# Patient Record
Sex: Male | Born: 1995 | Race: White | Hispanic: No | Marital: Single | Smoking: Never smoker
Health system: Southern US, Community
[De-identification: ages and names within clinical notes are randomized; demographics above are authoritative.]

---
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
## 2016-12-31 DIAGNOSIS — R5383 Other fatigue: Secondary | ICD-10-CM | POA: Diagnosis not present

## 2016-12-31 DIAGNOSIS — Z6838 Body mass index (BMI) 38.0-38.9, adult: Secondary | ICD-10-CM | POA: Diagnosis not present

## 2016-12-31 DIAGNOSIS — Z1389 Encounter for screening for other disorder: Secondary | ICD-10-CM | POA: Diagnosis not present

## 2017-04-10 DIAGNOSIS — B9689 Other specified bacterial agents as the cause of diseases classified elsewhere: Secondary | ICD-10-CM | POA: Diagnosis not present

## 2017-04-10 DIAGNOSIS — L648 Other androgenic alopecia: Secondary | ICD-10-CM | POA: Diagnosis not present

## 2017-04-10 DIAGNOSIS — L0211 Cutaneous abscess of neck: Secondary | ICD-10-CM | POA: Diagnosis not present

## 2017-10-06 ENCOUNTER — Emergency Department (HOSPITAL_COMMUNITY): Payer: BLUE CROSS/BLUE SHIELD

## 2017-10-06 ENCOUNTER — Other Ambulatory Visit: Payer: Self-pay

## 2017-10-06 ENCOUNTER — Emergency Department (HOSPITAL_COMMUNITY)
Admission: EM | Admit: 2017-10-06 | Discharge: 2017-10-06 | Disposition: A | Discharge status: Home / Self Care | Payer: BLUE CROSS/BLUE SHIELD | Attending: Emergency Medicine | Admitting: Emergency Medicine

## 2017-10-06 ENCOUNTER — Encounter (HOSPITAL_COMMUNITY): Payer: Self-pay | Admitting: Emergency Medicine

## 2017-10-06 DIAGNOSIS — R079 Chest pain, unspecified: Secondary | ICD-10-CM | POA: Diagnosis not present

## 2017-10-06 DIAGNOSIS — R0789 Other chest pain: Secondary | ICD-10-CM | POA: Diagnosis not present

## 2017-10-06 LAB — BASIC METABOLIC PANEL
Anion gap: 7 (ref 5–15)
BUN: 11 mg/dL (ref 6–20)
CALCIUM: 9.8 mg/dL (ref 8.9–10.3)
CO2: 30 mmol/L (ref 22–32)
CREATININE: 0.89 mg/dL (ref 0.61–1.24)
Chloride: 102 mmol/L (ref 98–111)
GFR calc Af Amer: >60 mL/min (ref >60)
Glucose, Bld: 112 mg/dL — ABNORMAL HIGH (ref 70–99)
POTASSIUM: 3.7 mmol/L (ref 3.5–5.1)
SODIUM: 139 mmol/L (ref 135–145)

## 2017-10-06 LAB — CBC
HCT: 44.8 % (ref 39.0–52.0)
Hemoglobin: 15.5 g/dL (ref 13.0–17.0)
MCH: 30.1 pg (ref 26.0–34.0)
MCHC: 34.6 g/dL (ref 30.0–36.0)
MCV: 87 fL (ref 78.0–100.0)
PLATELETS: 277 10*3/uL (ref 150–400)
RBC: 5.15 MIL/uL (ref 4.22–5.81)
RDW: 13.1 % (ref 11.5–15.5)
WBC: 8.4 10*3/uL (ref 4.0–10.5)

## 2017-10-06 LAB — TROPONIN I

## 2017-10-06 MED ORDER — IBUPROFEN 400 MG PO TABS
600.0000 mg | ORAL_TABLET | Freq: Once | ORAL | Status: AC
  Administered 2017-10-06: 600 mg via ORAL
  Filled 2017-10-06: qty 2
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
## 2017-10-06 NOTE — ED Triage Notes (Signed)
Pt reports having sharp intermittent pains at the top of his chest right under throat with deep breathing.  States he feels very anxious and is not sure why.  Has been ongoing for 3 days.

## 2017-10-06 NOTE — ED Notes (Signed)
Pt and family updated about wait time.

## 2017-10-06 NOTE — Discharge Instructions (Addendum)
Take tylenol and Advil for pain. Follow up with your doctor in the next few days for recheck. Return here for worsening symptoms.

## 2017-10-23 DIAGNOSIS — Z6829 Body mass index (BMI) 29.0-29.9, adult: Secondary | ICD-10-CM | POA: Diagnosis not present

## 2017-10-23 DIAGNOSIS — Z23 Encounter for immunization: Secondary | ICD-10-CM | POA: Diagnosis not present

## 2017-10-23 DIAGNOSIS — M940 Chondrocostal junction syndrome [Tietze]: Secondary | ICD-10-CM | POA: Diagnosis not present

## 2017-10-23 DIAGNOSIS — R739 Hyperglycemia, unspecified: Secondary | ICD-10-CM | POA: Diagnosis not present

## 2017-10-23 DIAGNOSIS — R634 Abnormal weight loss: Secondary | ICD-10-CM | POA: Diagnosis not present

## 2017-10-23 DIAGNOSIS — Z0000 Encounter for general adult medical examination without abnormal findings: Secondary | ICD-10-CM | POA: Diagnosis not present

## 2017-10-23 DIAGNOSIS — E663 Overweight: Secondary | ICD-10-CM | POA: Diagnosis not present

## 2017-11-09 DIAGNOSIS — H5712 Ocular pain, left eye: Secondary | ICD-10-CM | POA: Diagnosis not present

## 2017-11-09 DIAGNOSIS — R51 Headache: Secondary | ICD-10-CM | POA: Diagnosis not present

## 2017-11-09 DIAGNOSIS — B309 Viral conjunctivitis, unspecified: Secondary | ICD-10-CM | POA: Diagnosis not present

## 2017-11-09 DIAGNOSIS — R200 Anesthesia of skin: Secondary | ICD-10-CM | POA: Diagnosis not present

## 2017-11-18 DIAGNOSIS — H20012 Primary iridocyclitis, left eye: Secondary | ICD-10-CM | POA: Diagnosis not present

## 2017-11-20 DIAGNOSIS — H20012 Primary iridocyclitis, left eye: Secondary | ICD-10-CM | POA: Diagnosis not present

## 2017-11-23 DIAGNOSIS — H209 Unspecified iridocyclitis: Secondary | ICD-10-CM | POA: Diagnosis not present

## 2017-11-23 DIAGNOSIS — H5712 Ocular pain, left eye: Secondary | ICD-10-CM | POA: Diagnosis not present

## 2017-11-24 DIAGNOSIS — H20012 Primary iridocyclitis, left eye: Secondary | ICD-10-CM | POA: Diagnosis not present

## 2017-11-28 DIAGNOSIS — H18822 Corneal disorder due to contact lens, left eye: Secondary | ICD-10-CM | POA: Diagnosis not present

## 2017-11-28 DIAGNOSIS — H16292 Other keratoconjunctivitis, left eye: Secondary | ICD-10-CM | POA: Diagnosis not present

## 2017-12-11 DIAGNOSIS — H18822 Corneal disorder due to contact lens, left eye: Secondary | ICD-10-CM | POA: Diagnosis not present

## 2017-12-11 DIAGNOSIS — H16292 Other keratoconjunctivitis, left eye: Secondary | ICD-10-CM | POA: Diagnosis not present

## 2018-01-20 DIAGNOSIS — H179 Unspecified corneal scar and opacity: Secondary | ICD-10-CM | POA: Diagnosis not present

## 2018-01-20 DIAGNOSIS — H10432 Chronic follicular conjunctivitis, left eye: Secondary | ICD-10-CM | POA: Diagnosis not present

## 2018-01-20 DIAGNOSIS — H16422 Pannus (corneal), left eye: Secondary | ICD-10-CM | POA: Diagnosis not present

## 2018-01-20 DIAGNOSIS — Z79899 Other long term (current) drug therapy: Secondary | ICD-10-CM | POA: Diagnosis not present

## 2018-01-20 DIAGNOSIS — H15102 Unspecified episcleritis, left eye: Secondary | ICD-10-CM | POA: Diagnosis not present

## 2018-02-13 DIAGNOSIS — Z1389 Encounter for screening for other disorder: Secondary | ICD-10-CM | POA: Diagnosis not present

## 2018-02-13 DIAGNOSIS — Z6828 Body mass index (BMI) 28.0-28.9, adult: Secondary | ICD-10-CM | POA: Diagnosis not present

## 2018-02-13 DIAGNOSIS — H2000 Unspecified acute and subacute iridocyclitis: Secondary | ICD-10-CM | POA: Diagnosis not present

## 2018-02-13 DIAGNOSIS — E663 Overweight: Secondary | ICD-10-CM | POA: Diagnosis not present

## 2018-02-13 DIAGNOSIS — R768 Other specified abnormal immunological findings in serum: Secondary | ICD-10-CM | POA: Diagnosis not present

## 2018-03-03 DIAGNOSIS — H16423 Pannus (corneal), bilateral: Secondary | ICD-10-CM | POA: Diagnosis not present

## 2018-03-03 DIAGNOSIS — H179 Unspecified corneal scar and opacity: Secondary | ICD-10-CM | POA: Diagnosis not present

## 2018-03-03 DIAGNOSIS — H15102 Unspecified episcleritis, left eye: Secondary | ICD-10-CM | POA: Diagnosis not present

## 2018-07-02 DIAGNOSIS — M549 Dorsalgia, unspecified: Secondary | ICD-10-CM | POA: Diagnosis not present

## 2018-07-02 DIAGNOSIS — Z1389 Encounter for screening for other disorder: Secondary | ICD-10-CM | POA: Diagnosis not present

## 2018-07-02 DIAGNOSIS — S31821S Laceration without foreign body of left buttock, sequela: Secondary | ICD-10-CM | POA: Diagnosis not present

## 2018-07-02 DIAGNOSIS — Z7251 High risk heterosexual behavior: Secondary | ICD-10-CM | POA: Diagnosis not present

## 2018-07-02 DIAGNOSIS — Z6827 Body mass index (BMI) 27.0-27.9, adult: Secondary | ICD-10-CM | POA: Diagnosis not present

## 2018-07-02 DIAGNOSIS — S31831D Laceration without foreign body of anus, subsequent encounter: Secondary | ICD-10-CM | POA: Diagnosis not present

## 2018-07-14 DIAGNOSIS — Z8669 Personal history of other diseases of the nervous system and sense organs: Secondary | ICD-10-CM | POA: Diagnosis not present

## 2018-07-14 DIAGNOSIS — M542 Cervicalgia: Secondary | ICD-10-CM | POA: Diagnosis not present

## 2018-07-14 DIAGNOSIS — M546 Pain in thoracic spine: Secondary | ICD-10-CM | POA: Diagnosis not present

## 2018-07-14 DIAGNOSIS — M79671 Pain in right foot: Secondary | ICD-10-CM | POA: Diagnosis not present

## 2018-07-14 DIAGNOSIS — M2550 Pain in unspecified joint: Secondary | ICD-10-CM | POA: Diagnosis not present

## 2018-07-14 DIAGNOSIS — R768 Other specified abnormal immunological findings in serum: Secondary | ICD-10-CM | POA: Diagnosis not present

## 2018-07-14 DIAGNOSIS — M79642 Pain in left hand: Secondary | ICD-10-CM | POA: Diagnosis not present

## 2018-07-14 DIAGNOSIS — M79641 Pain in right hand: Secondary | ICD-10-CM | POA: Diagnosis not present

## 2018-07-14 DIAGNOSIS — M79643 Pain in unspecified hand: Secondary | ICD-10-CM | POA: Diagnosis not present

## 2018-07-14 DIAGNOSIS — M533 Sacrococcygeal disorders, not elsewhere classified: Secondary | ICD-10-CM | POA: Diagnosis not present

## 2018-07-14 DIAGNOSIS — M545 Low back pain: Secondary | ICD-10-CM | POA: Diagnosis not present

## 2018-07-14 DIAGNOSIS — M549 Dorsalgia, unspecified: Secondary | ICD-10-CM | POA: Diagnosis not present

## 2018-07-14 DIAGNOSIS — M79672 Pain in left foot: Secondary | ICD-10-CM | POA: Diagnosis not present

## 2018-07-15 DIAGNOSIS — M79641 Pain in right hand: Secondary | ICD-10-CM | POA: Diagnosis not present

## 2018-07-15 DIAGNOSIS — M79642 Pain in left hand: Secondary | ICD-10-CM | POA: Diagnosis not present

## 2018-07-15 DIAGNOSIS — M1990 Unspecified osteoarthritis, unspecified site: Secondary | ICD-10-CM | POA: Diagnosis not present

## 2018-07-15 DIAGNOSIS — M79671 Pain in right foot: Secondary | ICD-10-CM | POA: Diagnosis not present

## 2018-07-15 DIAGNOSIS — M549 Dorsalgia, unspecified: Secondary | ICD-10-CM | POA: Diagnosis not present

## 2018-07-28 DIAGNOSIS — R768 Other specified abnormal immunological findings in serum: Secondary | ICD-10-CM | POA: Diagnosis not present

## 2018-07-28 DIAGNOSIS — M79643 Pain in unspecified hand: Secondary | ICD-10-CM | POA: Diagnosis not present

## 2018-07-28 DIAGNOSIS — M549 Dorsalgia, unspecified: Secondary | ICD-10-CM | POA: Diagnosis not present

## 2018-07-28 DIAGNOSIS — M2550 Pain in unspecified joint: Secondary | ICD-10-CM | POA: Diagnosis not present

## 2018-08-05 ENCOUNTER — Other Ambulatory Visit: Payer: Self-pay | Admitting: Rheumatology

## 2018-08-05 DIAGNOSIS — M461 Sacroiliitis, not elsewhere classified: Secondary | ICD-10-CM

## 2018-08-07 ENCOUNTER — Other Ambulatory Visit: Payer: BLUE CROSS/BLUE SHIELD

## 2018-08-14 DIAGNOSIS — K602 Anal fissure, unspecified: Secondary | ICD-10-CM | POA: Diagnosis not present

## 2018-11-24 DIAGNOSIS — H179 Unspecified corneal scar and opacity: Secondary | ICD-10-CM | POA: Diagnosis not present

## 2018-11-24 DIAGNOSIS — H16423 Pannus (corneal), bilateral: Secondary | ICD-10-CM | POA: Diagnosis not present

## 2018-11-24 DIAGNOSIS — H15102 Unspecified episcleritis, left eye: Secondary | ICD-10-CM | POA: Diagnosis not present

## 2018-12-01 DIAGNOSIS — H179 Unspecified corneal scar and opacity: Secondary | ICD-10-CM | POA: Diagnosis not present

## 2018-12-01 DIAGNOSIS — H16423 Pannus (corneal), bilateral: Secondary | ICD-10-CM | POA: Diagnosis not present

## 2018-12-01 DIAGNOSIS — H209 Unspecified iridocyclitis: Secondary | ICD-10-CM | POA: Diagnosis not present

## 2018-12-10 DIAGNOSIS — Z9889 Other specified postprocedural states: Secondary | ICD-10-CM | POA: Diagnosis not present

## 2018-12-10 DIAGNOSIS — H209 Unspecified iridocyclitis: Secondary | ICD-10-CM | POA: Diagnosis not present

## 2018-12-10 DIAGNOSIS — H16423 Pannus (corneal), bilateral: Secondary | ICD-10-CM | POA: Diagnosis not present

## 2018-12-10 DIAGNOSIS — H179 Unspecified corneal scar and opacity: Secondary | ICD-10-CM | POA: Diagnosis not present

## 2018-12-17 DIAGNOSIS — H179 Unspecified corneal scar and opacity: Secondary | ICD-10-CM | POA: Diagnosis not present

## 2018-12-17 DIAGNOSIS — Z8669 Personal history of other diseases of the nervous system and sense organs: Secondary | ICD-10-CM | POA: Diagnosis not present

## 2018-12-17 DIAGNOSIS — H209 Unspecified iridocyclitis: Secondary | ICD-10-CM | POA: Diagnosis not present

## 2018-12-17 DIAGNOSIS — H16423 Pannus (corneal), bilateral: Secondary | ICD-10-CM | POA: Diagnosis not present

## 2018-12-28 DIAGNOSIS — H04123 Dry eye syndrome of bilateral lacrimal glands: Secondary | ICD-10-CM | POA: Diagnosis not present

## 2019-01-06 ENCOUNTER — Ambulatory Visit
Admission: RE | Admit: 2019-01-06 | Discharge: 2019-01-06 | Disposition: A | Discharge status: Home / Self Care | Payer: BC Managed Care – PPO | Source: Ambulatory Visit | Attending: Rheumatology | Admitting: Rheumatology

## 2019-01-06 ENCOUNTER — Other Ambulatory Visit: Payer: Self-pay

## 2019-01-06 DIAGNOSIS — R600 Localized edema: Secondary | ICD-10-CM | POA: Diagnosis not present

## 2019-01-06 DIAGNOSIS — M461 Sacroiliitis, not elsewhere classified: Secondary | ICD-10-CM

## 2019-01-12 DIAGNOSIS — H16422 Pannus (corneal), left eye: Secondary | ICD-10-CM | POA: Diagnosis not present

## 2019-01-12 DIAGNOSIS — Z8669 Personal history of other diseases of the nervous system and sense organs: Secondary | ICD-10-CM | POA: Diagnosis not present

## 2019-01-12 DIAGNOSIS — H179 Unspecified corneal scar and opacity: Secondary | ICD-10-CM | POA: Diagnosis not present

## 2019-01-12 DIAGNOSIS — H209 Unspecified iridocyclitis: Secondary | ICD-10-CM | POA: Diagnosis not present

## 2019-04-17 ENCOUNTER — Ambulatory Visit: Payer: Self-pay | Attending: Internal Medicine

## 2019-04-17 DIAGNOSIS — Z23 Encounter for immunization: Secondary | ICD-10-CM

## 2019-04-17 NOTE — Progress Notes (Signed)
   Covid-19 Vaccination Clinic  Name:  FREDI GEILER    MRN: [id number] DOB: [birth year]  04/17/2019  Mr. Luecke was observed post Covid-19 immunization for 15 minutes without incident. He was provided with Vaccine Information Sheet and instruction to access the V-Safe system.   Mr. Edelman was instructed to call 911 with any severe reactions post vaccine: Marland Kitchen Difficulty breathing  . Swelling of face and throat  . A fast heartbeat  . A bad rash all over body  . Dizziness and weakness   Immunizations Administered    Name Date Dose VIS Date Route   Moderna COVID-19 Vaccine 04/17/2019 12:16 PM 0.5 mL 01/05/2019 Intramuscular   Manufacturer: Moderna   Lot: [id number]   NDC: 07622-633-35

## 2019-05-19 ENCOUNTER — Ambulatory Visit: Payer: Self-pay | Attending: Internal Medicine

## 2019-05-19 DIAGNOSIS — Z23 Encounter for immunization: Secondary | ICD-10-CM

## 2019-05-19 NOTE — Progress Notes (Signed)
   Covid-19 Vaccination Clinic  Name:  Anthony Patel    MRN: [id number] DOB: 1995-06-15  05/19/2019  [name] was observed post Covid-19 immunization for 15 minutes without incident. He was provided with Vaccine Information Sheet and instruction to access the V-Safe system.   [name] was instructed to call 911 with any severe reactions post vaccine: Marland Kitchen Difficulty breathing  . Swelling of face and throat  . A fast heartbeat  . A bad rash all over body  . Dizziness and weakness   Immunizations Administered    Name Date Dose VIS Date Route   Moderna COVID-19 Vaccine 05/19/2019 11:46 AM 0.5 mL 01/05/2019 Intramuscular   Manufacturer: Moderna   Lot: [id number]   NDC: 53614-431-54

## 2019-09-06 ENCOUNTER — Inpatient Hospital Stay
Admission: RE | Admit: 2019-09-06 | Discharge: 2019-09-06 | Disposition: A | Discharge status: Home / Self Care | Payer: Self-pay | Source: Ambulatory Visit

## 2019-09-06 ENCOUNTER — Emergency Department (HOSPITAL_COMMUNITY): Payer: 59

## 2019-09-06 ENCOUNTER — Other Ambulatory Visit: Payer: Self-pay

## 2019-09-06 ENCOUNTER — Emergency Department (HOSPITAL_COMMUNITY)
Admission: EM | Admit: 2019-09-06 | Discharge: 2019-09-06 | Disposition: A | Discharge status: Home / Self Care | Payer: 59 | Attending: Emergency Medicine | Admitting: Emergency Medicine

## 2019-09-06 ENCOUNTER — Encounter (HOSPITAL_COMMUNITY): Payer: Self-pay | Admitting: *Deleted

## 2019-09-06 DIAGNOSIS — N50811 Right testicular pain: Secondary | ICD-10-CM | POA: Insufficient documentation

## 2019-09-06 LAB — URINALYSIS, ROUTINE W REFLEX MICROSCOPIC
Bilirubin Urine: NEGATIVE
Glucose, UA: NEGATIVE mg/dL
Hgb urine dipstick: NEGATIVE
Ketones, ur: 5 mg/dL — AB
Leukocytes,Ua: NEGATIVE
Nitrite: NEGATIVE
Protein, ur: NEGATIVE mg/dL
Specific Gravity, Urine: 1.012 (ref 1.005–1.030)
pH: 6 (ref 5.0–8.0)

## 2019-09-06 NOTE — ED Provider Notes (Signed)
[facility] EMERGENCY DEPARTMENT Provider Note   CSN: [id number] Arrival date & time: 09/06/19  1530     History Chief Complaint  Patient presents with  . Testicle Pain    Tory P Keast is a 24 y.o. male with a past medical history of possible ankylosing spondylitis who presents today for evaluation of pain in his right testicle.  He reports that a month ago he developed pain in the right testicle.  He notes that his dog had jumped on him prior to this starting however states that she has done so in the past without the same pain reactions.  He reports that he has not had any unprotected sex in over a year, denies any penile discharge.  No scrotal swelling.  He denies any fevers, lesions, abnormal rashes.  He states that normally wears briefs and when he does not have the brace on he notes increased pain.  He reports that occasionally he has a bulge in the right inguinal area however this is not constant.  He denies any other aggravating or alleviating factors.  HPI     History reviewed. No pertinent past medical history.  There are no problems to display for this patient.   History reviewed. No pertinent surgical history.     No family history on file.  Social History   Tobacco Use  . Smoking status: Never Smoker  . Smokeless tobacco: Never Used  Substance Use Topics  . Alcohol use: Yes    Comment: 1-3 times weekly  . Drug use: Never    Home Medications Prior to Admission medications   Not on File    Allergies    Penicillins  Review of Systems   Review of Systems  Constitutional: Negative for chills, fatigue and fever.  Gastrointestinal: Negative for abdominal pain, diarrhea, nausea and vomiting.  Genitourinary: Positive for frequency, testicular pain and urgency. Negative for difficulty urinating, discharge, dysuria, flank pain, genital sores, hematuria, penile pain, penile swelling and scrotal swelling.  Musculoskeletal: Negative for back pain and neck pain.    Skin: Negative for color change, rash and wound.  Neurological: Negative for weakness and headaches.  All other systems reviewed and are negative.   Physical Exam Updated Vital Signs BP 120/86 (BP Location: Right Arm)   Pulse 79   Temp 99 F (37.2 C) (Oral)   Resp 17   SpO2 100%   Physical Exam Vitals and nursing note reviewed. Exam conducted with a chaperone present Richardo Hanks).  Constitutional:      General: He is not in acute distress.    Appearance: He is well-developed. He is not diaphoretic.  HENT:     Head: Normocephalic and atraumatic.  Eyes:     General: No scleral icterus.       Right eye: No discharge.        Left eye: No discharge.     Conjunctiva/sclera: Conjunctivae normal.  Cardiovascular:     Rate and Rhythm: Normal rate and regular rhythm.  Pulmonary:     Effort: Pulmonary effort is normal. No respiratory distress.     Breath sounds: No stridor.  Abdominal:     General: There is no distension.     Tenderness: There is no abdominal tenderness.     Hernia: There is no hernia in the left inguinal area or right inguinal area.  Genitourinary:    Penis: Normal and circumcised.      Testes:        Right: Tenderness present. Mass,  swelling, testicular hydrocele or varicocele not present.        Left: Mass, tenderness, testicular hydrocele or varicocele not present.     Epididymis:     Right: Normal.     Left: Normal.     Comments: No abnormal erythema, edema, induration or lesions noted on the anterior scrotum. Musculoskeletal:        General: No deformity.     Cervical back: Normal range of motion.  Skin:    General: Skin is warm and dry.  Neurological:     Mental Status: He is alert.     Motor: No abnormal muscle tone.  Psychiatric:        Mood and Affect: Mood is anxious.        Behavior: Behavior normal.     ED Results / Procedures / Treatments   Labs (all labs ordered are listed, but only abnormal results are displayed) Labs Reviewed   URINALYSIS, ROUTINE W REFLEX MICROSCOPIC - Abnormal; Notable for the following components:      Result Value   Ketones, ur 5 (*)    All other components within normal limits  GC/CHLAMYDIA PROBE AMP ([facility]) NOT AT [facility]    EKG None  Radiology US SCROTUM W/DOPPLER  Result Date: 09/06/2019 CLINICAL DATA:  Order requisition indicated left testicular pain, clarified with patient ordering provider that this is right testicular pain EXAM: SCROTAL ULTRASOUND DOPPLER ULTRASOUND OF THE TESTICLES TECHNIQUE: Complete ultrasound examination of the testicles, epididymis, and other scrotal structures was performed. Color and spectral Doppler ultrasound were also utilized to evaluate blood flow to the testicles. COMPARISON:  None. FINDINGS: Right testicle Measurements: 5.3 x 2.7 x 3.2 cm. No mass or microlithiasis visualized. Left testicle Measurements: 4.8 x 2.0 x 3.3 cm. No mass or microlithiasis visualized. Right epididymis:  Normal in size and appearance. Left epididymis:  Normal in size and appearance. Hydrocele: Trace amount fluid bilaterally likely within physiologic normal. Varicocele:  None visualized. Pulsed Doppler interrogation of both testes demonstrates normal low resistance arterial and venous waveforms bilaterally. IMPRESSION: No acute testicular injury. No concerning testicular mass, evidence of torsion or epididymo-orchitis. Electronically Signed   By: Kreg Shropshire M.D.   On: 09/06/2019 17:57    Procedures Procedures (including critical care time)  Medications Ordered in ED Medications - No data to display  ED Course  I have reviewed the triage vital signs and the nursing notes.  Pertinent labs & imaging results that were available during my care of the patient were reviewed by me and considered in my medical decision making (see chart for details).    MDM Rules/Calculators/A&P                         Patient is a 24 year old man who presents today for evaluation of 1 month of  pain in his right testicle.  On exam he has mild tenderness to palpation in the right testicle diffusely.  This started after his dog jumped on him however he reports that the dog is jumped on him similarly multiple times before and he has not had this lingering pain.  He notes occasional swelling superior to the testicles mostly on the right side however on exam unable to produce a hernia.  ReSound was obtained without evidence of acute testicular injury, concerning mass evidence of torsion or epididymoorchitis.  No visualized scrotal lesions.  UA without evidence of infection.  GC testing is sent.  I recommended urology follow-up.  We discussed that he may have a hernia today and simply unable to detect today.  Return precautions were discussed with patient who states their understanding.  At the time of discharge patient denied any unaddressed complaints or concerns.  Patient is agreeable for discharge home.  Note: Portions of this report may have been transcribed using voice recognition software. Every effort was made to ensure accuracy; however, inadvertent computerized transcription errors may be present  Final Clinical Impression(s) / ED Diagnoses Final diagnoses:  Right testicular pain    Rx / DC Orders ED Discharge Orders    None       Norman Clay 09/06/19 2305    Milagros Loll, MD 09/08/19 1555

## 2019-09-06 NOTE — ED Triage Notes (Signed)
Right sided testicle pain for a month

## 2019-09-06 NOTE — Discharge Instructions (Addendum)
Please continue to wear supportive underwear.  Today your urine and ultrasound were both reassuring.  I have given you follow-up with urology.  If your pain becomes severe, you develop fevers, your pain suddenly worsens, or you have other concerns please seek additional medical care and evaluation.  You have testing for gonorrhea and chlamydia sent.  If these are positive you will be notified and you will need to seek treatment.  Please take Ibuprofen (Advil, motrin) and Tylenol (acetaminophen) to relieve your pain.  You may take up to 600 MG (3 pills) of normal strength ibuprofen every 8 hours as needed.  In between doses of ibuprofen you make take tylenol, up to 1,000 mg (two extra strength pills).  Do not take more than 3,000 mg tylenol in a 24 hour period.  Please check all medication labels as many medications such as pain and cold medications may contain tylenol.  Do not drink alcohol while taking these medications.  Do not take other NSAID'S while taking ibuprofen (such as aleve or naproxen).  Please take ibuprofen with food to decrease stomach upset.

## 2019-09-08 LAB — GC/CHLAMYDIA PROBE AMP (~~LOC~~) NOT AT ARMC
Chlamydia: NEGATIVE
Comment: NEGATIVE
Comment: NORMAL
Neisseria Gonorrhea: NEGATIVE

## 2020-04-14 IMAGING — MR MR SACRUM / SI JOINTS WO CM
4 of 5 series · 31 of 48 positions shown · non-contrast
Comparison: None.

CLINICAL DATA: SI joint pain for 1-2 years.  Worsening pain.

EXAM:
MRI PELVIS WITHOUT CONTRAST
TECHNIQUE: Multiplanar, multisequence MR imaging of the pelvis was performed.
No intravenous contrast was administered.

[Series 2: T1 · axial · 4.0mm · 0.52mm/px · z∈[-14,+122]mm · 8 of 30 slices shown (1 of 2)]
[im 1/30]
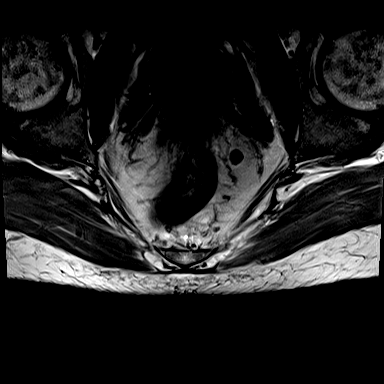
[im 4/30]
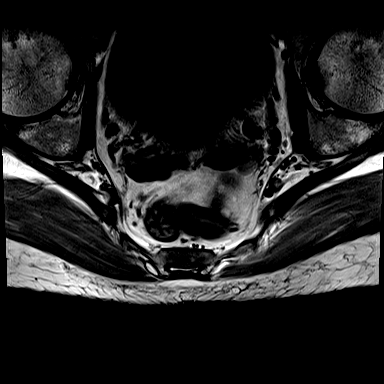
[im 10/30]
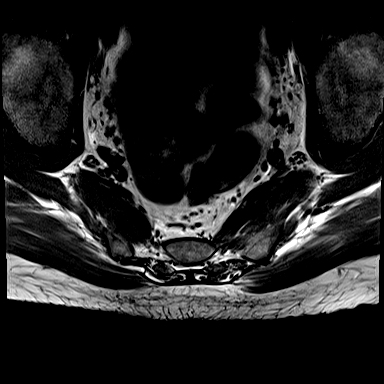
[im 13/30]
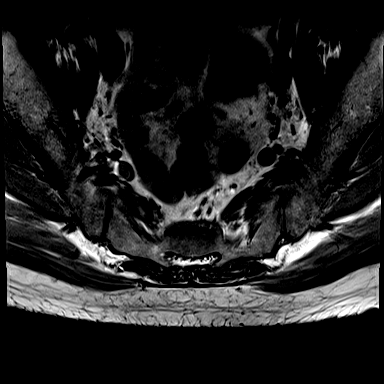
[im 17/30]
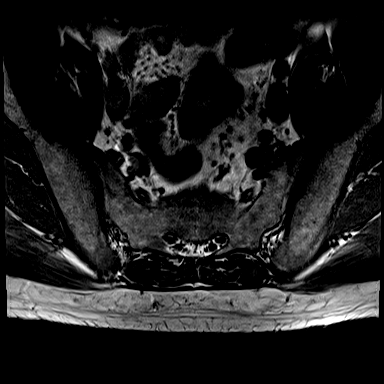
[im 20/30]
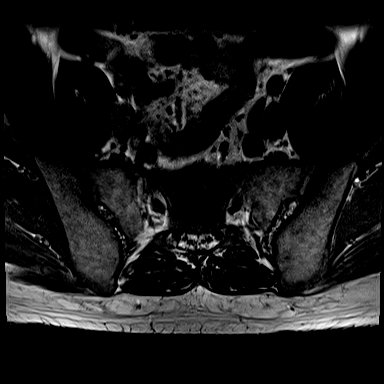
[im 26/30]
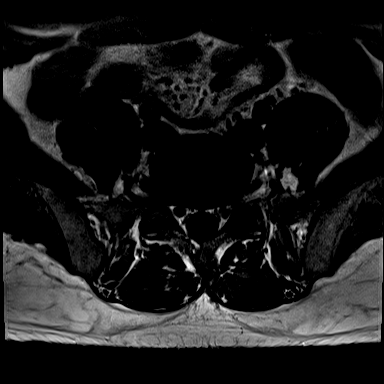
[im 30/30]
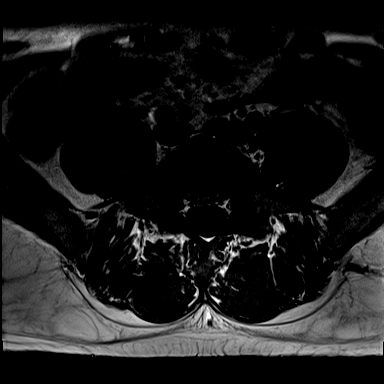

[Series 3: T2 fat-sat · axial · 4.0mm · 0.52mm/px · z∈[-14,+122]mm · 8 of 30 slices shown (1 of 2)]
[im 1/30]
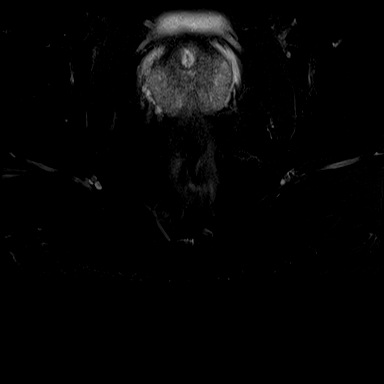
[im 4/30]
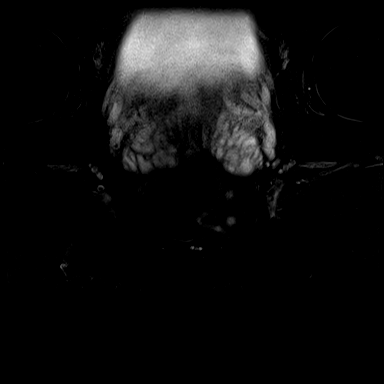
[im 10/30]
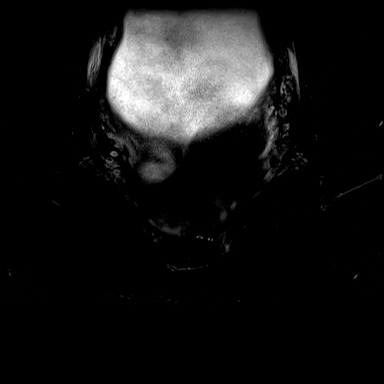
[im 13/30]
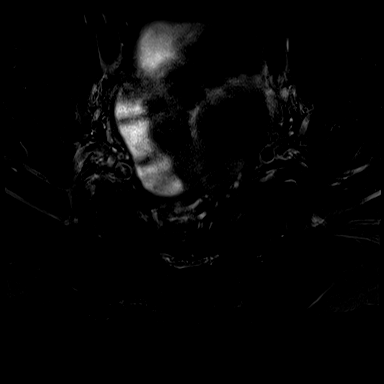
[im 17/30]
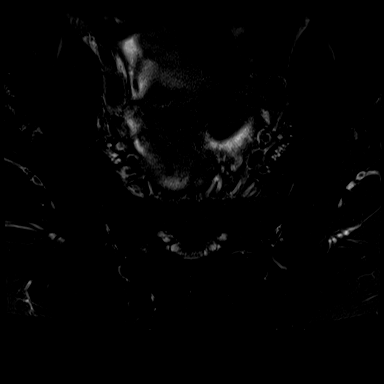
[im 20/30]
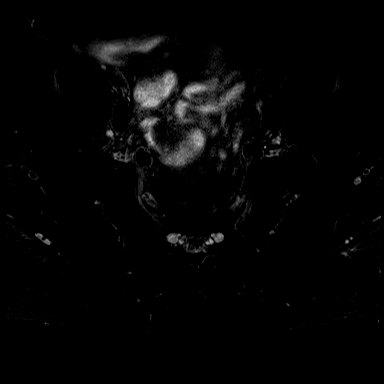
[im 26/30]
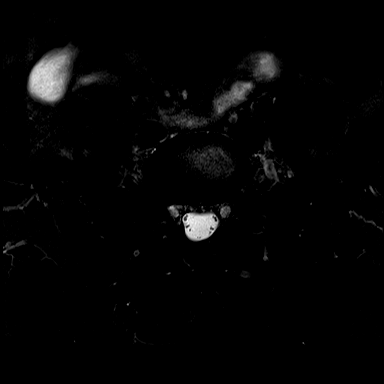
[im 30/30]
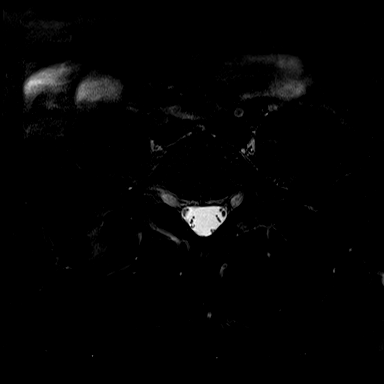

[Series 4: T2 fat-sat · sagittal · 4.0mm · 0.75mm/px · 8 of 30 slices shown (2 of 2)]
[im 1/30]
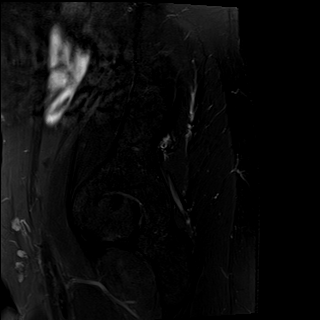
[im 4/30]
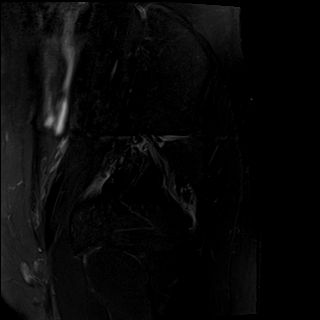
[im 10/30]
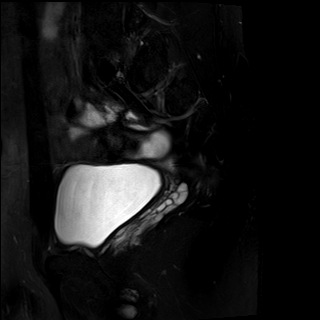
[im 13/30]
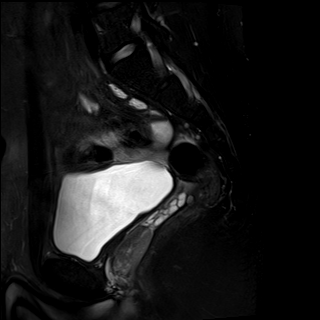
[im 17/30]
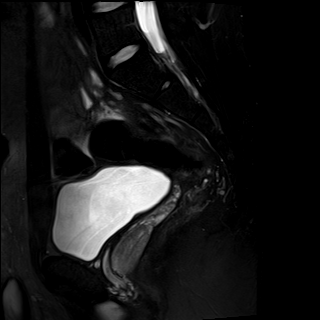
[im 20/30]
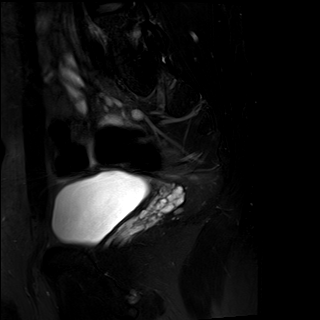
[im 26/30]
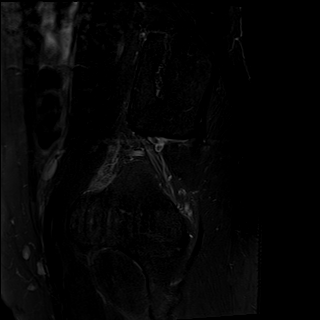
[im 30/30]
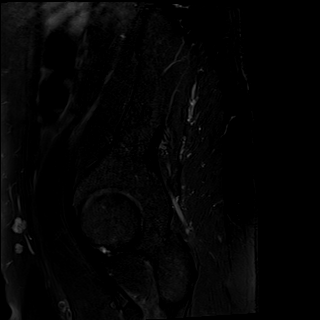

[Series 6: T1 · coronal · 4.0mm · 0.49mm/px · 7 of 25 slices shown (2 of 2)]
[im 1/25]
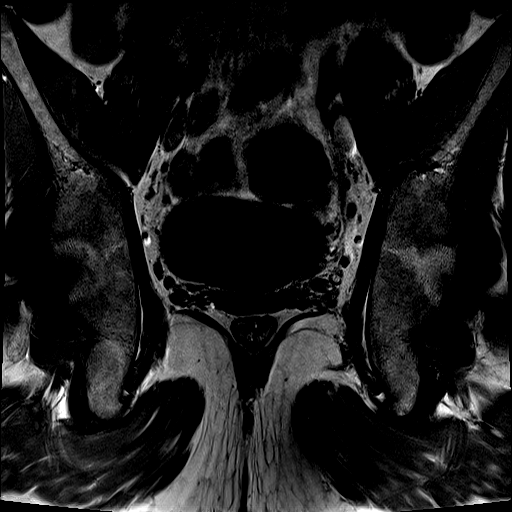
[im 4/25]
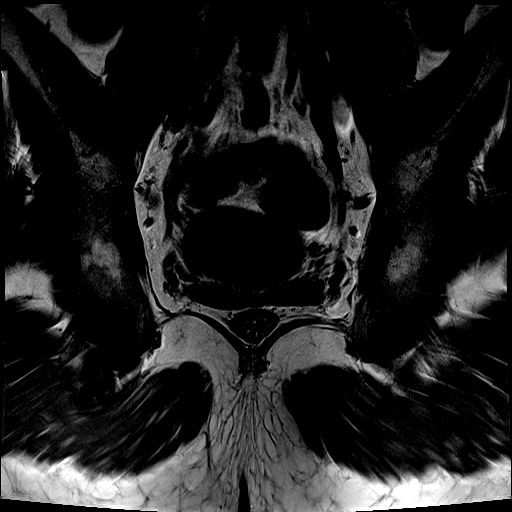
[im 7/25]
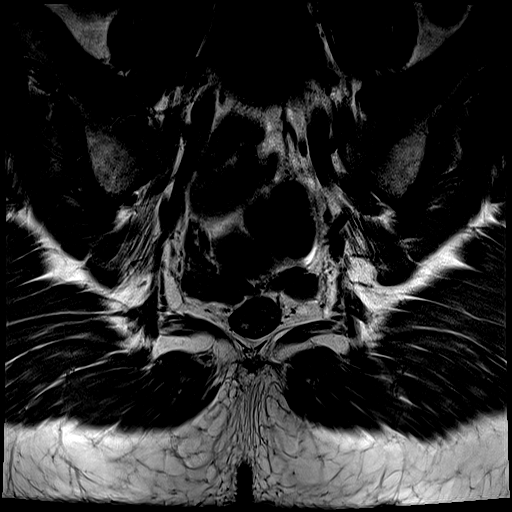
[im 10/25]
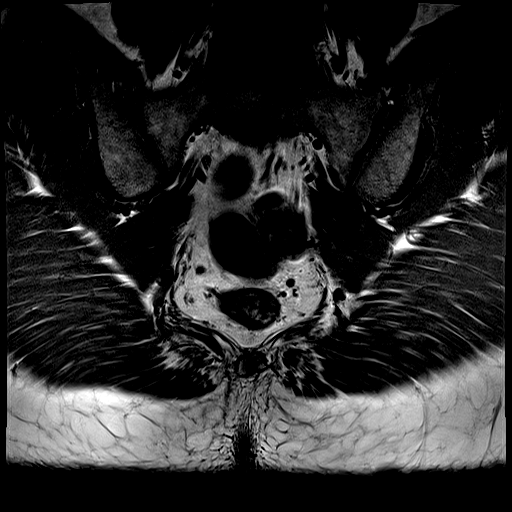
[im 13/25]
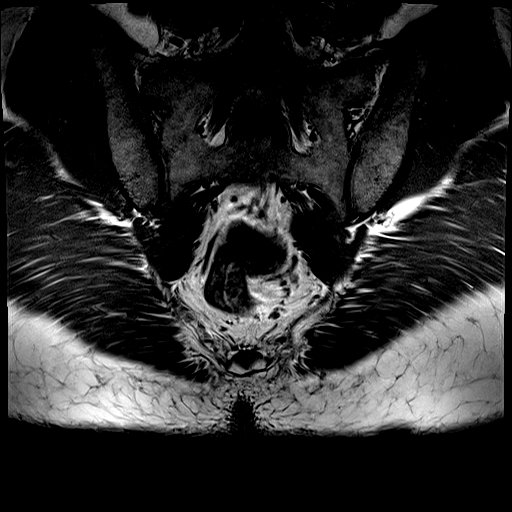
[im 16/25]
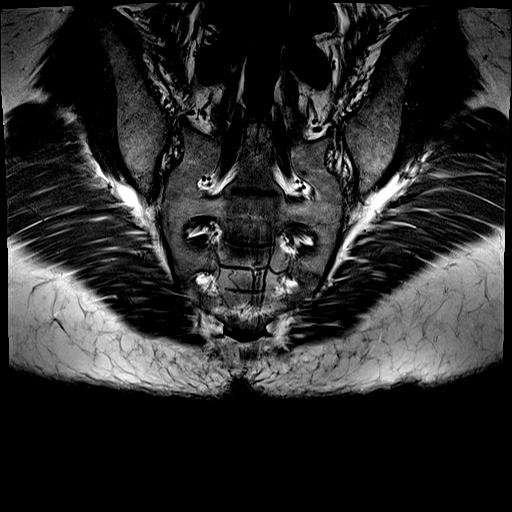
[im 22/25]
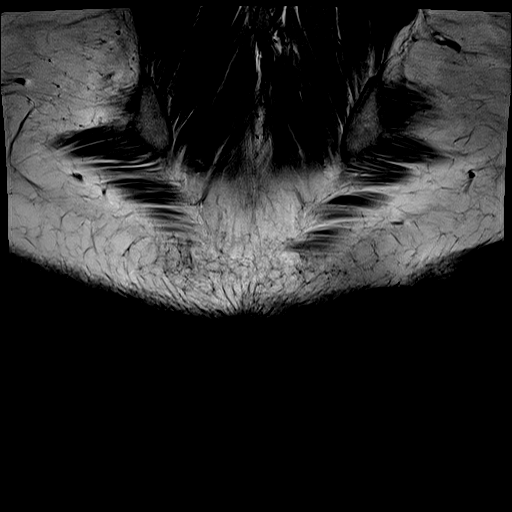

[31 of 48 positions shown; findings below may reference images not displayed]

FINDINGS: Bones/Joint/Cartilage

Marrow edema within the second and third coccygeal vertebral bodies
with mild increased signal within the disc space.

No other marrow signal abnormality. No fracture or dislocation.
Normal alignment. No joint effusion.

No SI joint widening or erosive changes.  No SI joint effusion.

Ligaments, Muscles and Tendons
Muscles are normal. No muscle atrophy. No intramuscular fluid
collection or hematoma.

Soft tissue
No fluid collection or hematoma.  No soft tissue mass.
IMPRESSION: 1. Marrow edema within the second and third coccygeal vertebral
bodies with mild increased signal within the disc space. The
appearance is concerning for sequela of trauma. No surrounding soft
tissue edema which would make infection less likely.

## 2020-12-13 IMAGING — US US SCROTUM W/ DOPPLER COMPLETE
1 series · 14 of 25 positions shown · non-contrast
Comparison: None.

CLINICAL DATA: Order requisition indicated left testicular pain,
clarified with patient ordering provider that this is right
testicular pain

EXAM:
SCROTAL ULTRASOUND
DOPPLER ULTRASOUND OF THE TESTICLES
TECHNIQUE: Complete ultrasound examination of the testicles, epididymis, and
other scrotal structures was performed. Color and spectral Doppler
ultrasound were also utilized to evaluate blood flow to the
testicles.

[Series 1: us scrotum w/doppler · 14 of 67 slices shown]
[im 1/67]
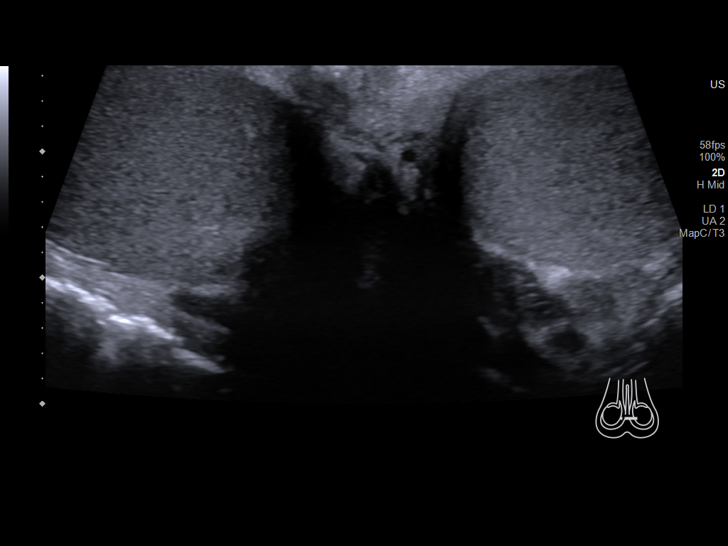
[im 6/67]
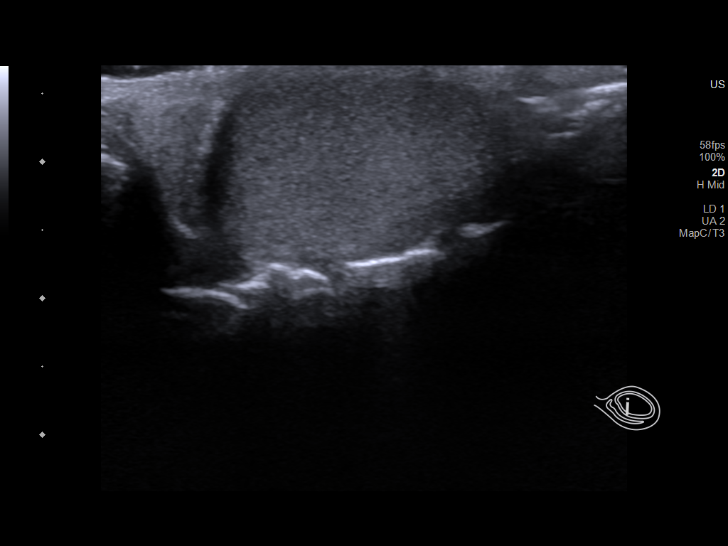
[im 12/67]
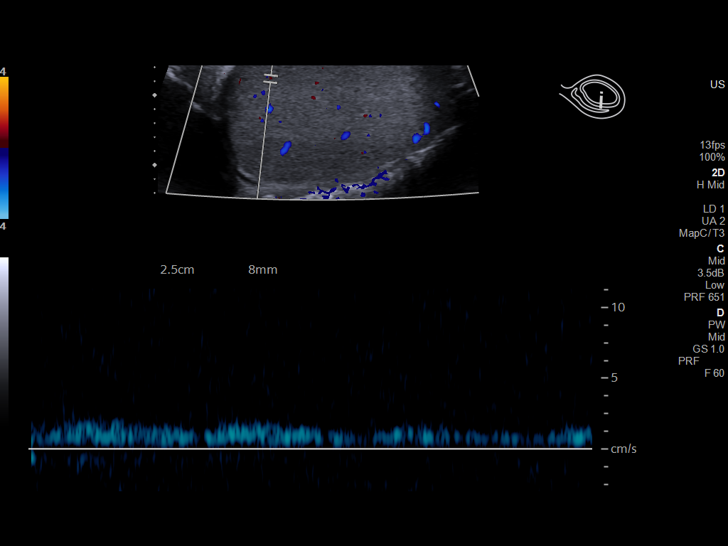
[im 17/67]
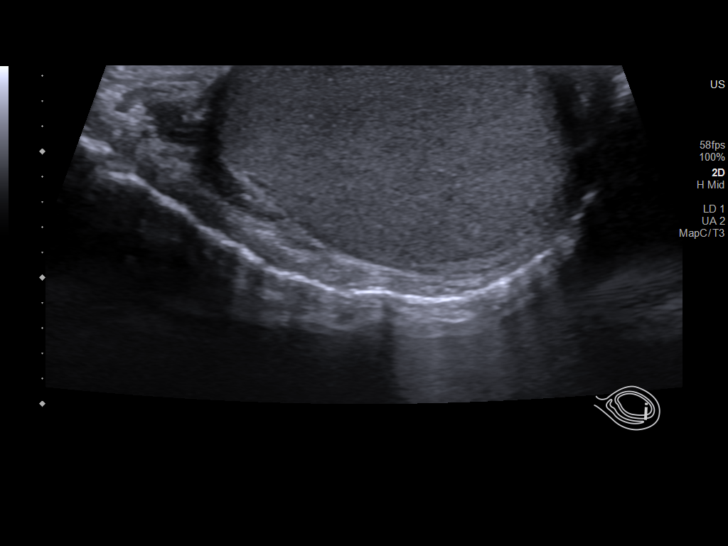
[im 23/67]
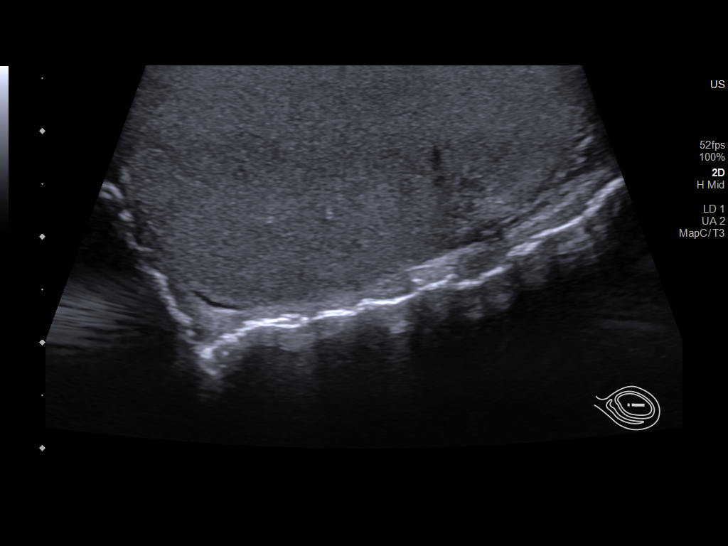
[im 25/67]
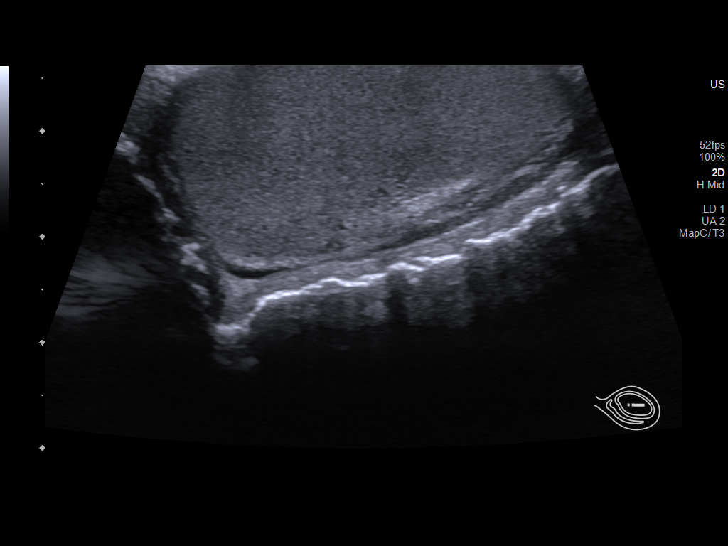
[im 31/67]
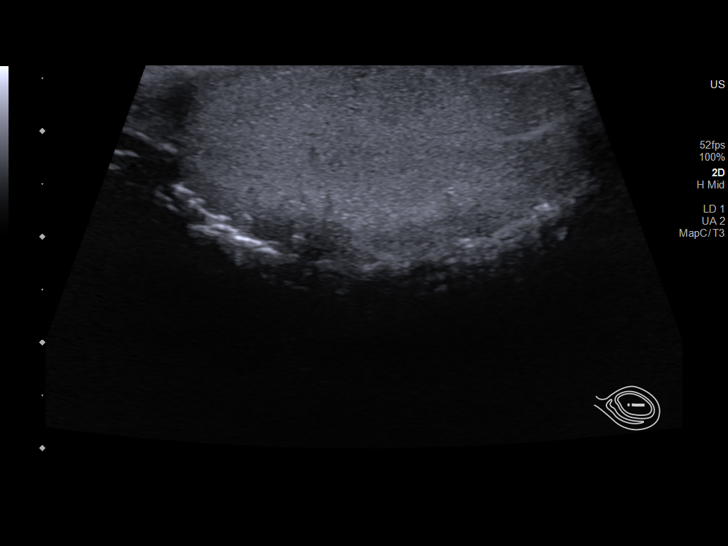
[im 36/67]
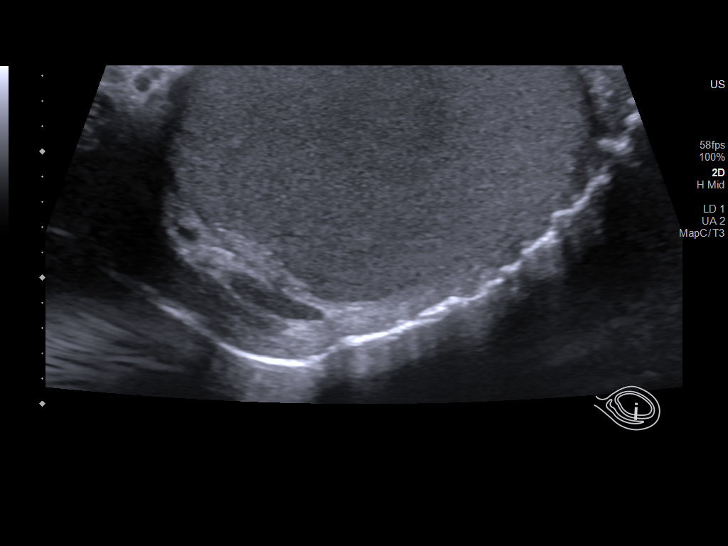
[im 42/67]
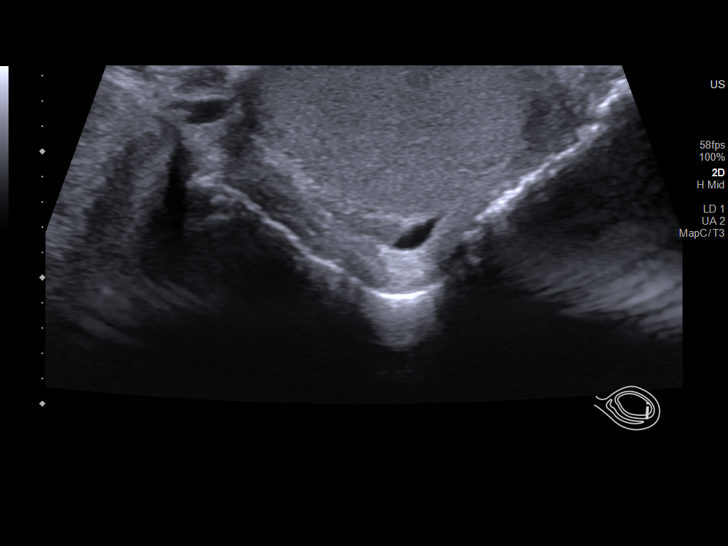
[im 45/67]
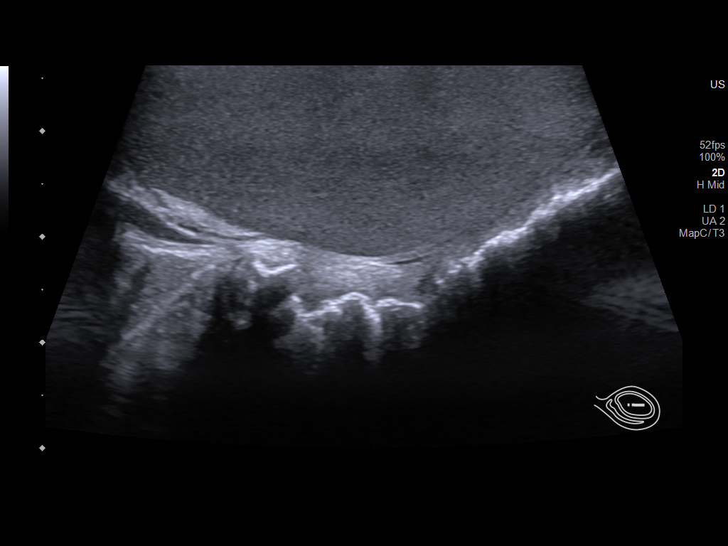
[im 50/67]
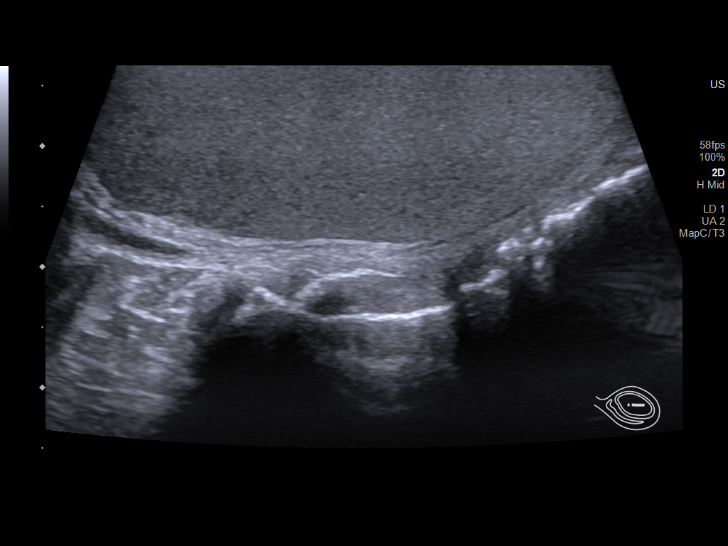
[im 56/67]
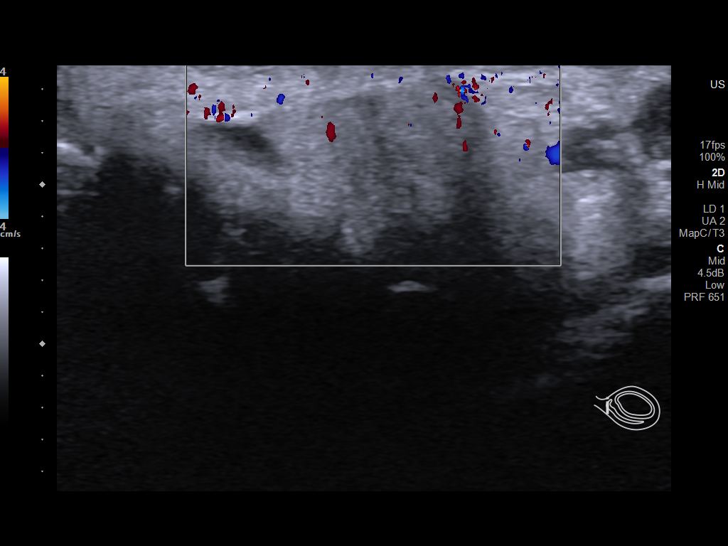
[im 61/67]
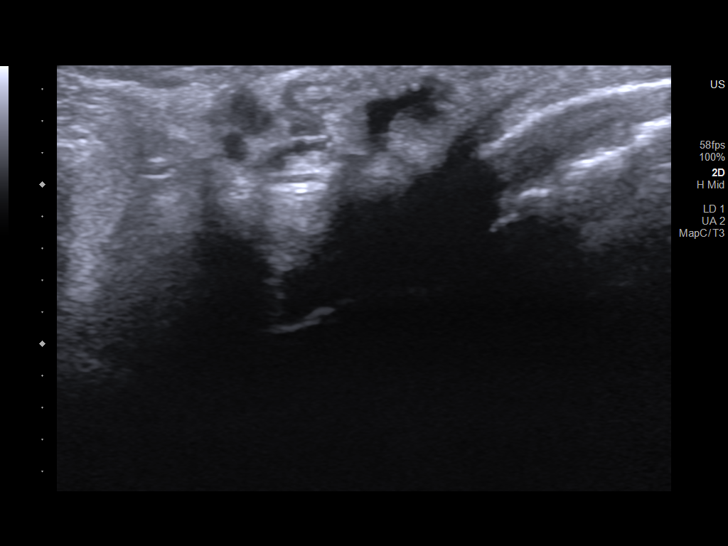
[im 67/67]
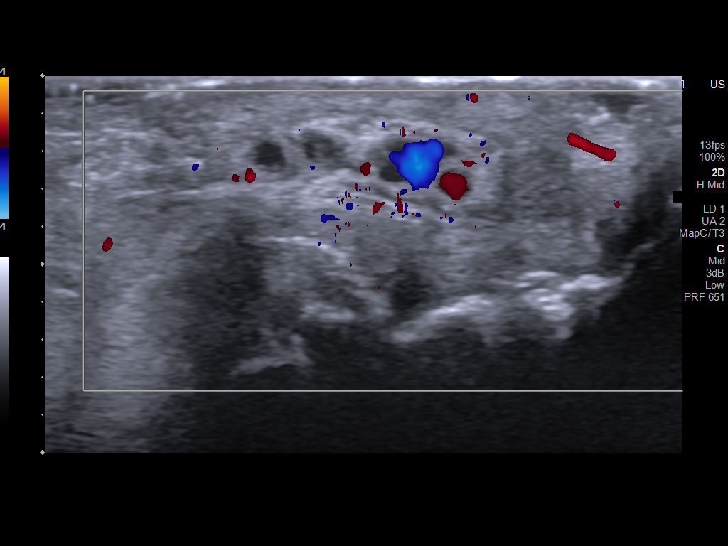

[14 of 25 positions shown; findings below may reference images not displayed]

FINDINGS: Right testicle

Measurements: 5.3 x 2.7 x 3.2 cm. No mass or microlithiasis
visualized.

Left testicle

Measurements: 4.8 x 2.0 x 3.3 cm. No mass or microlithiasis
visualized.

Right epididymis:  Normal in size and appearance.

Left epididymis:  Normal in size and appearance.

Hydrocele: Trace amount fluid bilaterally likely within physiologic
normal.

Varicocele:  None visualized.

Pulsed Doppler interrogation of both testes demonstrates normal low
resistance arterial and venous waveforms bilaterally.
IMPRESSION: No acute testicular injury. No concerning testicular mass, evidence
of torsion or epididymo-orchitis.

## 2022-03-12 DIAGNOSIS — E663 Overweight: Secondary | ICD-10-CM | POA: Diagnosis not present

## 2022-03-12 DIAGNOSIS — J069 Acute upper respiratory infection, unspecified: Secondary | ICD-10-CM | POA: Diagnosis not present

## 2022-03-12 DIAGNOSIS — Z6829 Body mass index (BMI) 29.0-29.9, adult: Secondary | ICD-10-CM | POA: Diagnosis not present

## 2022-10-28 ENCOUNTER — Encounter (HOSPITAL_COMMUNITY): Payer: Self-pay | Admitting: Emergency Medicine

## 2022-10-28 ENCOUNTER — Other Ambulatory Visit: Payer: Self-pay

## 2022-10-28 ENCOUNTER — Emergency Department (HOSPITAL_COMMUNITY)
Admission: EM | Admit: 2022-10-28 | Discharge: 2022-10-28 | Disposition: A | Discharge status: Home / Self Care | Payer: 59 | Attending: Emergency Medicine | Admitting: Emergency Medicine

## 2022-10-28 DIAGNOSIS — R1310 Dysphagia, unspecified: Secondary | ICD-10-CM | POA: Diagnosis not present

## 2022-10-28 DIAGNOSIS — R070 Pain in throat: Secondary | ICD-10-CM | POA: Diagnosis not present

## 2022-10-28 DIAGNOSIS — Z5321 Procedure and treatment not carried out due to patient leaving prior to being seen by health care provider: Secondary | ICD-10-CM | POA: Diagnosis not present

## 2022-10-28 NOTE — ED Triage Notes (Signed)
Pt presents with dysphagia, for approx 2 weeks is having internal throat pain, not sore throat, not having any problems eating or swallowing.

## 2022-12-03 DIAGNOSIS — Z113 Encounter for screening for infections with a predominantly sexual mode of transmission: Secondary | ICD-10-CM | POA: Diagnosis not present

## 2022-12-12 DIAGNOSIS — E663 Overweight: Secondary | ICD-10-CM | POA: Diagnosis not present

## 2022-12-12 DIAGNOSIS — L7211 Pilar cyst: Secondary | ICD-10-CM | POA: Diagnosis not present

## 2022-12-12 DIAGNOSIS — Z1331 Encounter for screening for depression: Secondary | ICD-10-CM | POA: Diagnosis not present

## 2022-12-12 DIAGNOSIS — Z0001 Encounter for general adult medical examination with abnormal findings: Secondary | ICD-10-CM | POA: Diagnosis not present

## 2022-12-12 DIAGNOSIS — Z6828 Body mass index (BMI) 28.0-28.9, adult: Secondary | ICD-10-CM | POA: Diagnosis not present

## 2023-08-28 DIAGNOSIS — Z6831 Body mass index (BMI) 31.0-31.9, adult: Secondary | ICD-10-CM | POA: Diagnosis not present

## 2023-08-28 DIAGNOSIS — E6609 Other obesity due to excess calories: Secondary | ICD-10-CM | POA: Diagnosis not present
